# Patient Record
Sex: Female | Born: 1965 | Race: White | Hispanic: No | Marital: Married
Health system: Southern US, Community
[De-identification: ages and names within clinical notes are randomized; demographics above are authoritative.]

---

## 2000-04-23 ENCOUNTER — Other Ambulatory Visit: Admission: RE | Admit: 2000-04-23 | Discharge: 2000-04-23 | Payer: Self-pay | Admitting: Obstetrics & Gynecology

## 2001-05-03 ENCOUNTER — Other Ambulatory Visit: Admission: RE | Admit: 2001-05-03 | Discharge: 2001-05-03 | Payer: Self-pay | Admitting: Obstetrics & Gynecology

## 2002-06-10 ENCOUNTER — Other Ambulatory Visit: Admission: RE | Admit: 2002-06-10 | Discharge: 2002-06-10 | Payer: Self-pay | Admitting: Obstetrics & Gynecology

## 2003-07-12 ENCOUNTER — Other Ambulatory Visit: Admission: RE | Admit: 2003-07-12 | Discharge: 2003-07-12 | Payer: Self-pay | Admitting: Obstetrics and Gynecology

## 2004-08-01 ENCOUNTER — Other Ambulatory Visit: Admission: RE | Admit: 2004-08-01 | Discharge: 2004-08-01 | Payer: Self-pay | Admitting: Obstetrics & Gynecology

## 2005-11-28 ENCOUNTER — Encounter: Admission: RE | Admit: 2005-11-28 | Discharge: 2005-11-28 | Payer: Self-pay | Admitting: Obstetrics & Gynecology

## 2010-06-16 ENCOUNTER — Encounter: Payer: Self-pay | Admitting: Obstetrics & Gynecology

## 2013-02-18 ENCOUNTER — Emergency Department (HOSPITAL_COMMUNITY)
Admission: EM | Admit: 2013-02-18 | Discharge: 2013-02-18 | Disposition: A | Discharge status: Home / Self Care | Payer: BC Managed Care – PPO | Attending: Emergency Medicine | Admitting: Emergency Medicine

## 2013-02-18 ENCOUNTER — Encounter (HOSPITAL_COMMUNITY): Payer: Self-pay

## 2013-02-18 ENCOUNTER — Emergency Department (HOSPITAL_COMMUNITY): Payer: BC Managed Care – PPO

## 2013-02-18 DIAGNOSIS — Z79899 Other long term (current) drug therapy: Secondary | ICD-10-CM | POA: Insufficient documentation

## 2013-02-18 DIAGNOSIS — R55 Syncope and collapse: Secondary | ICD-10-CM

## 2013-02-18 DIAGNOSIS — Z3202 Encounter for pregnancy test, result negative: Secondary | ICD-10-CM | POA: Insufficient documentation

## 2013-02-18 DIAGNOSIS — R42 Dizziness and giddiness: Secondary | ICD-10-CM | POA: Insufficient documentation

## 2013-02-18 DIAGNOSIS — R110 Nausea: Secondary | ICD-10-CM | POA: Insufficient documentation

## 2013-02-18 LAB — POCT I-STAT TROPONIN I

## 2013-02-18 LAB — COMPREHENSIVE METABOLIC PANEL
ALT: 15 U/L (ref 0–35)
AST: 18 U/L (ref 0–37)
Alkaline Phosphatase: 76 U/L (ref 39–117)
GFR calc non Af Amer: >90 mL/min (ref >90)
Total Protein: 7.1 g/dL (ref 6.0–8.3)

## 2013-02-18 LAB — POCT PREGNANCY, URINE: Preg Test, Ur: NEGATIVE

## 2013-02-18 LAB — CBC
HCT: 41 % (ref 36.0–46.0)
Platelets: 218 10*3/uL (ref 150–400)
WBC: 10 10*3/uL (ref 4.0–10.5)

## 2013-02-18 MED ORDER — SODIUM CHLORIDE 0.9 % IV BOLUS (SEPSIS)
1000.0000 mL | Freq: Once | INTRAVENOUS | Status: AC
  Administered 2013-02-18: 1000 mL via INTRAVENOUS

## 2013-02-18 MED ORDER — ASPIRIN 81 MG PO CHEW
324.0000 mg | CHEWABLE_TABLET | Freq: Once | ORAL | Status: AC
  Administered 2013-02-18: 324 mg via ORAL
  Filled 2013-02-18: qty 4

## 2013-02-18 MED ORDER — ONDANSETRON HCL 4 MG/2ML IJ SOLN
4.0000 mg | Freq: Once | INTRAMUSCULAR | Status: AC
  Administered 2013-02-18: 4 mg via INTRAVENOUS
  Filled 2013-02-18: qty 2

## 2013-02-18 NOTE — ED Notes (Signed)
Pt was with her mom in MD office. Nurse was attempting to get blood from mother and pt passed out. After a few moments she was alert. She stated she felt hot and sweaty and nauseous.  Pt now feels weak and nauseous.

## 2013-02-18 NOTE — Progress Notes (Signed)
   CARE MANAGEMENT ED NOTE 02/18/2013  Patient:  SHARAE, ZAPPULLA   Account Number:  [id number]  Date Initiated:  02/18/2013  Documentation initiated by:  Edd Arbour  Subjective/Objective Assessment:     Subjective/Objective Assessment Detail:     Action/Plan:   Action/Plan Detail:   pt confirmed while in [facility] ED 02/18/13 pcp is Theressa Millard EPIC was updated   Anticipated DC Date:  02/18/2013     Status Recommendation to Physician:   Result of Recommendation:    Other ED Services  Consult Working Plan    DC Planning Services  Other    Choice offered to / List presented to:            Status of service:  Completed, signed off  ED Comments:   ED Comments Detail:

## 2013-02-18 NOTE — ED Provider Notes (Signed)
CSN: [id number]     Arrival date & time 02/18/13  1350 History   First MD Initiated Contact with Patient 02/18/13 1457     Chief Complaint  Patient presents with  . Loss of Consciousness   (Consider location/radiation/quality/duration/timing/severity/associated sxs/prior Treatment) HPI Patient presents after syncopal episode. Patient was in her usual state of health until approximately one hour prior to ED arrival. At that time she was witnessing a blood draw on her mother.  The patient felt lightheaded, nauseous.  She subsequently had an episode of loss of consciousness for several moments.  Upon awakening she had no headache, no chest pain, dyspnea. On my evaluation the patient is essentially back to normal, with no focal complaints.  Minor nausea. She endorses a history of prior syncopal events, though with no frequency. No new medication, diet, she does not smoke, she does not drink. She does endorse a recent sick contact with a young female who had nausea and vomiting.  History reviewed. No pertinent past medical history. History reviewed. No pertinent past surgical history. No family history on file. History  Substance Use Topics  . Smoking status: Not on file  . Smokeless tobacco: Not on file  . Alcohol Use: Not on file   OB History   Grav Para Term Preterm Abortions TAB SAB Ect Mult Living                 Review of Systems  Constitutional:       Per HPI, otherwise negative  HENT:       Per HPI, otherwise negative  Respiratory:       Per HPI, otherwise negative  Cardiovascular:       Per HPI, otherwise negative  Gastrointestinal: Positive for nausea. Negative for vomiting.  Endocrine:       Negative aside from HPI  Genitourinary:       Neg aside from HPI   Musculoskeletal:       Per HPI, otherwise negative  Skin: Negative.   Neurological: Positive for syncope and light-headedness. Negative for weakness.    Allergies  Bee venom; Erythromycin; and  Adhesive  Home Medications   Current Outpatient Rx  Name  Route  Sig  Dispense  Refill  . loratadine (CLARITIN) 10 MG tablet   Oral   Take 10 mg by mouth daily.         . sertraline (ZOLOFT) 100 MG tablet   Oral   Take 150 mg by mouth every morning.          BP 110/63  Pulse 76  Temp(Src) 98 F (36.7 C) (Oral)  Resp 15  SpO2 95% Physical Exam  Nursing note and vitals reviewed. Constitutional: She is oriented to person, place, and time. She appears well-developed and well-nourished. No distress.  HENT:  Head: Normocephalic and atraumatic.  Eyes: Conjunctivae and EOM are normal.  Cardiovascular: Normal rate and regular rhythm.   Pulmonary/Chest: Effort normal and breath sounds normal. No stridor. No respiratory distress.  Abdominal: She exhibits no distension.  Musculoskeletal: She exhibits no edema.  Neurological: She is alert and oriented to person, place, and time. No cranial nerve deficit.  Skin: Skin is warm and dry.  Psychiatric: She has a normal mood and affect.    ED Course  Procedures (including critical care time) Labs Review Labs Reviewed  CBC  COMPREHENSIVE METABOLIC PANEL   Imaging Review No results found.  Cardiac: 75sr, nml  O2- 99%ra, nml  ECG w SR, 83, nonspecific T  wave changes  6:04 PM On repeat exam the patient appears calm.  No new complaints.  We discussed all lab findings.  MDM  No diagnosis found. Patient presents after a prodromal syncopal episode.  The denial of any chest pain, headache, dyspnea throughout his reassuring.  Patient has no risk factors for dysrhythmia or ACS. On repeat exam, and throughout the patient's emergency department monitoring she had no new dysrhythmia, or acute changes.  Patient was discharged with followup instructions. Incidentally, patient was found to have elevated random glucose value, and was counseled on the need to have this reevaluated.    Gerhard Munch, MD 02/18/13 [phone]

## 2014-03-20 ENCOUNTER — Other Ambulatory Visit: Payer: Self-pay | Admitting: Obstetrics and Gynecology

## 2014-03-20 DIAGNOSIS — N6310 Unspecified lump in the right breast, unspecified quadrant: Secondary | ICD-10-CM

## 2014-03-30 ENCOUNTER — Ambulatory Visit
Admission: RE | Admit: 2014-03-30 | Discharge: 2014-03-30 | Disposition: A | Discharge status: Home / Self Care | Payer: BC Managed Care – PPO | Source: Ambulatory Visit | Attending: Obstetrics and Gynecology | Admitting: Obstetrics and Gynecology

## 2014-03-30 DIAGNOSIS — N6310 Unspecified lump in the right breast, unspecified quadrant: Secondary | ICD-10-CM

## 2018-11-15 ENCOUNTER — Other Ambulatory Visit: Payer: Self-pay | Admitting: Obstetrics & Gynecology

## 2018-11-15 DIAGNOSIS — R928 Other abnormal and inconclusive findings on diagnostic imaging of breast: Secondary | ICD-10-CM

## 2018-11-19 ENCOUNTER — Other Ambulatory Visit: Payer: BC Managed Care – PPO

## 2018-11-19 ENCOUNTER — Inpatient Hospital Stay: Admission: RE | Admit: 2018-11-19 | Payer: BC Managed Care – PPO | Source: Ambulatory Visit

## 2018-11-24 ENCOUNTER — Ambulatory Visit
Admission: RE | Admit: 2018-11-24 | Discharge: 2018-11-24 | Disposition: A | Discharge status: Home / Self Care | Payer: BC Managed Care – PPO | Source: Ambulatory Visit | Attending: Obstetrics & Gynecology | Admitting: Obstetrics & Gynecology

## 2018-11-24 ENCOUNTER — Ambulatory Visit: Payer: BC Managed Care – PPO

## 2018-11-24 ENCOUNTER — Other Ambulatory Visit: Payer: Self-pay

## 2018-11-24 DIAGNOSIS — R928 Other abnormal and inconclusive findings on diagnostic imaging of breast: Secondary | ICD-10-CM

## 2021-01-17 ENCOUNTER — Other Ambulatory Visit: Payer: Self-pay | Admitting: Obstetrics & Gynecology

## 2021-01-17 DIAGNOSIS — R928 Other abnormal and inconclusive findings on diagnostic imaging of breast: Secondary | ICD-10-CM

## 2021-02-05 ENCOUNTER — Ambulatory Visit: Payer: BC Managed Care – PPO

## 2021-02-05 ENCOUNTER — Other Ambulatory Visit: Payer: Self-pay

## 2021-02-05 ENCOUNTER — Ambulatory Visit
Admission: RE | Admit: 2021-02-05 | Discharge: 2021-02-05 | Disposition: A | Discharge status: Home / Self Care | Payer: BC Managed Care – PPO | Source: Ambulatory Visit | Attending: Obstetrics & Gynecology | Admitting: Obstetrics & Gynecology

## 2021-02-05 DIAGNOSIS — R928 Other abnormal and inconclusive findings on diagnostic imaging of breast: Secondary | ICD-10-CM

## 2023-01-16 IMAGING — MG MM DIGITAL DIAGNOSTIC UNILAT*L* W/ TOMO W/ CAD
4 series · 4 of 12 positions shown · non-contrast
Comparison: Previous mammograms dating back to 8343.

CLINICAL DATA: 54-year-old female for further evaluation of
possible LEFT breast asymmetry on screening mammogram.

EXAM:
DIGITAL DIAGNOSTIC UNILATERAL LEFT MAMMOGRAM WITH TOMOSYNTHESIS AND
CAD
TECHNIQUE: Left digital diagnostic mammography and breast tomosynthesis was
performed. The images were evaluated with computer-aided detection.

[L CC synth-2D]
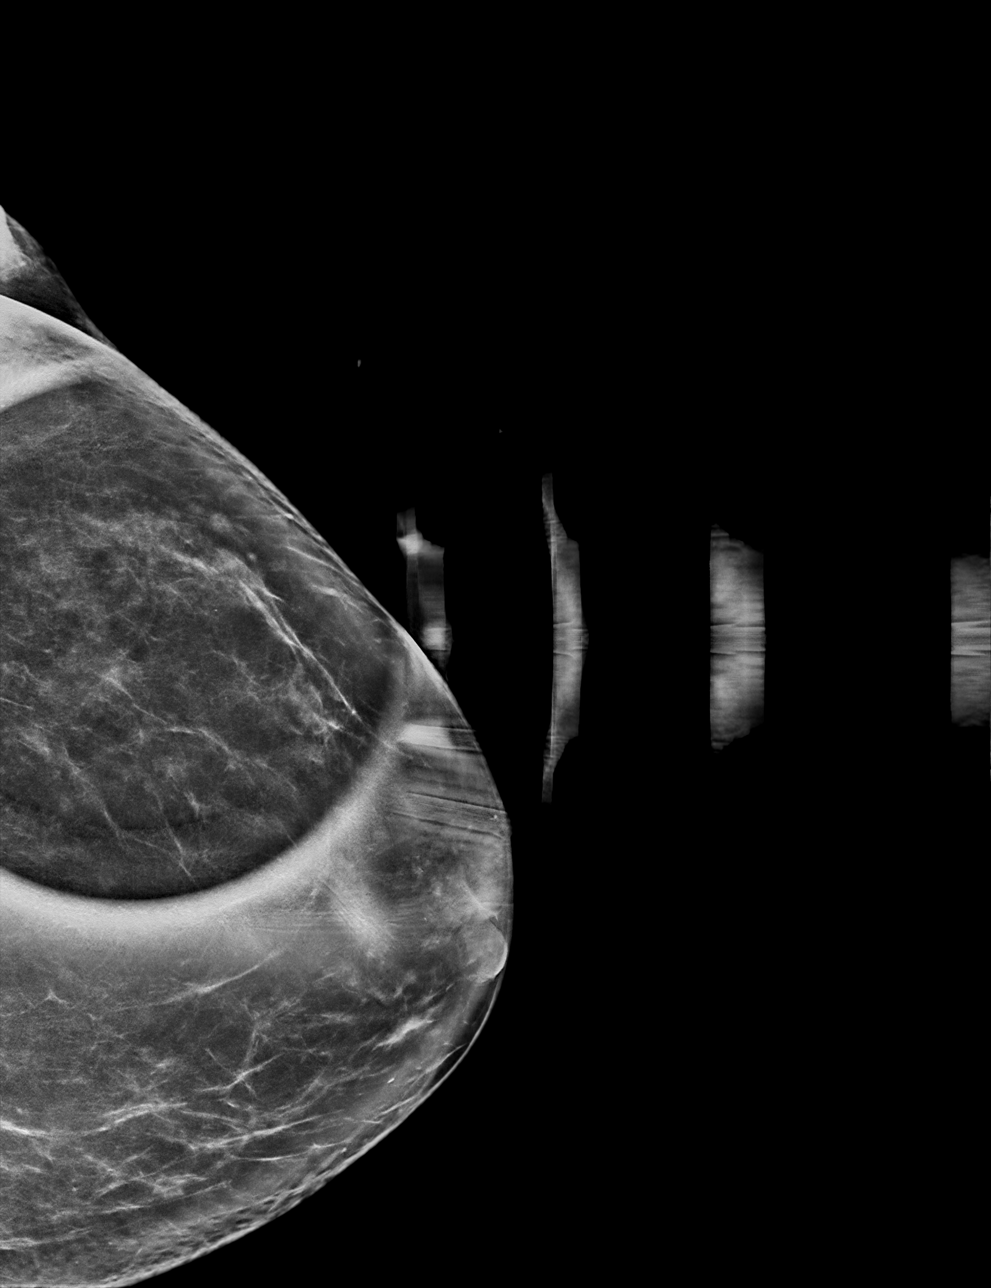

[L MLO synth-2D]
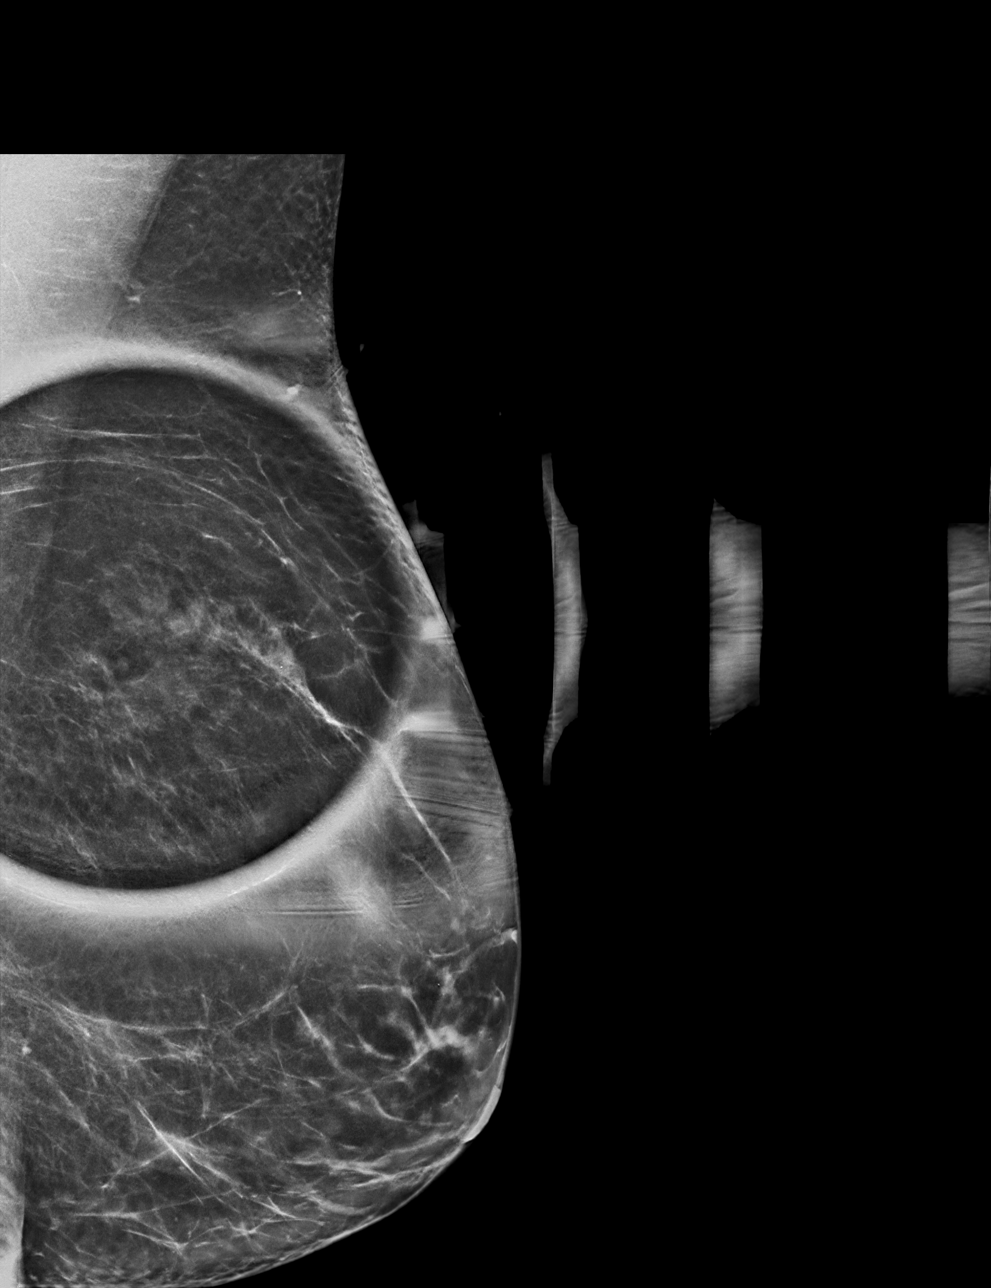

[L MLO tomo · tomo slice 39/76.0]
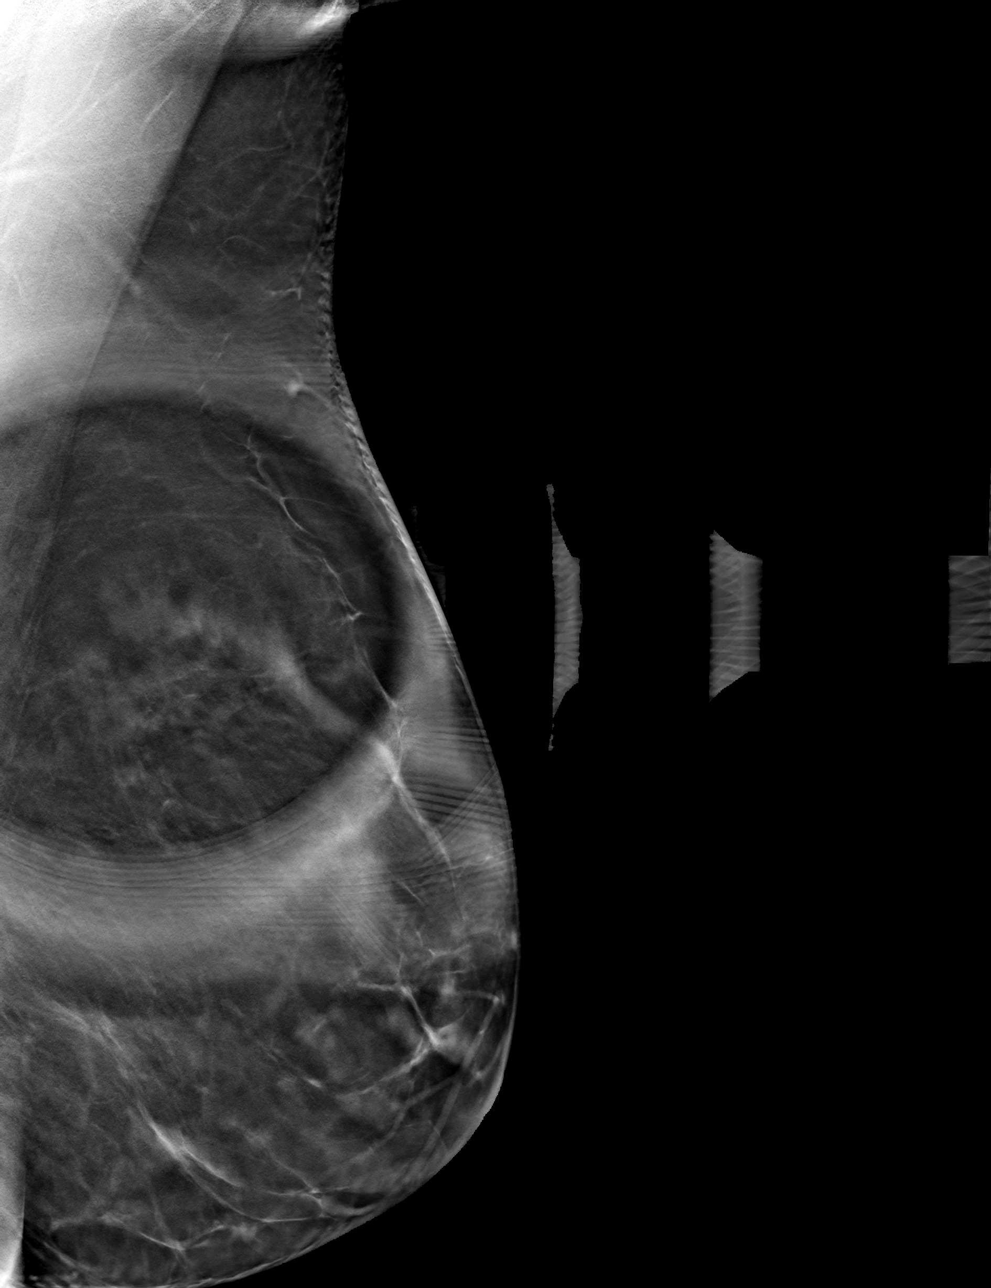

[L CC tomo · tomo slice 41/80.0]
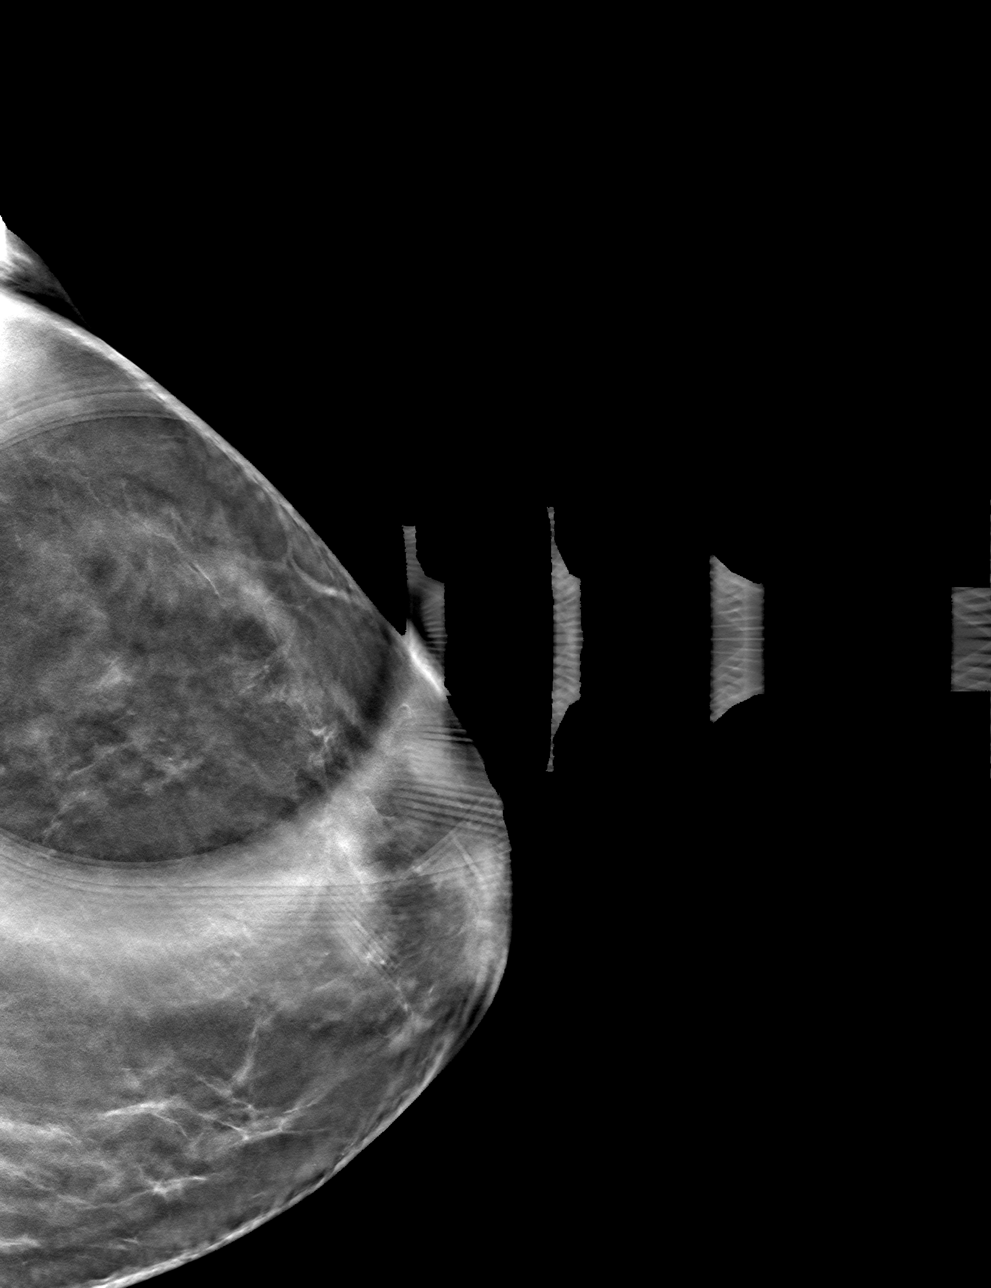

[4 of 12 positions shown; findings below may reference images not displayed]

ACR Breast Density Category b: There are scattered areas of
fibroglandular density.
FINDINGS: 2D/3D spot compression views of the LEFT breast demonstrate
dispersal of the screening study asymmetry without persistent
abnormality in this area.
IMPRESSION: No persistent abnormality at the site of the screening study
finding.

RECOMMENDATION:
Bilateral screening mammogram in 1 year.

I have discussed the findings and recommendations with the patient.
If applicable, a reminder letter will be sent to the patient
regarding the next appointment.

BI-RADS CATEGORY  1: Negative.
